# Patient Record
Sex: Female | Born: 1990
Health system: Western US, Academic
[De-identification: ages and names within clinical notes are randomized; demographics above are authoritative.]

---

## 2013-10-02 ENCOUNTER — Ambulatory Visit (INDEPENDENT_AMBULATORY_CARE_PROVIDER_SITE_OTHER): Payer: BC Managed Care – PPO | Admitting: Emergency Medicine

## 2013-10-02 ENCOUNTER — Ambulatory Visit (INDEPENDENT_AMBULATORY_CARE_PROVIDER_SITE_OTHER): Payer: BC Managed Care – PPO

## 2013-10-02 VITALS — BP 116/68 | HR 96 | Temp 98.1°F | Resp 18 | Ht 66.75 in | Wt 129.6 lb

## 2013-10-02 DIAGNOSIS — M549 Dorsalgia, unspecified: Secondary | ICD-10-CM

## 2013-10-02 DIAGNOSIS — S139XXA Sprain of joints and ligaments of unspecified parts of neck, initial encounter: Secondary | ICD-10-CM

## 2013-10-02 DIAGNOSIS — S335XXA Sprain of ligaments of lumbar spine, initial encounter: Secondary | ICD-10-CM

## 2013-10-02 MED ORDER — NAPROXEN SODIUM 550 MG PO TABS: 550.0000 mg | ORAL_TABLET | Freq: Two times a day (BID) | ORAL | Status: AC

## 2013-10-02 MED ORDER — CYCLOBENZAPRINE HCL 10 MG PO TABS: 10.0000 mg | ORAL_TABLET | Freq: Three times a day (TID) | ORAL | Status: AC | PRN
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
## 2015-06-20 IMAGING — CR DG LUMBAR SPINE COMPLETE 4+V
5 series · 5 of 5 positions shown · non-contrast
Comparison: None.

CLINICAL DATA: Low back pain.

EXAM:
LUMBAR SPINE - COMPLETE 4+ VIEW

[AP]
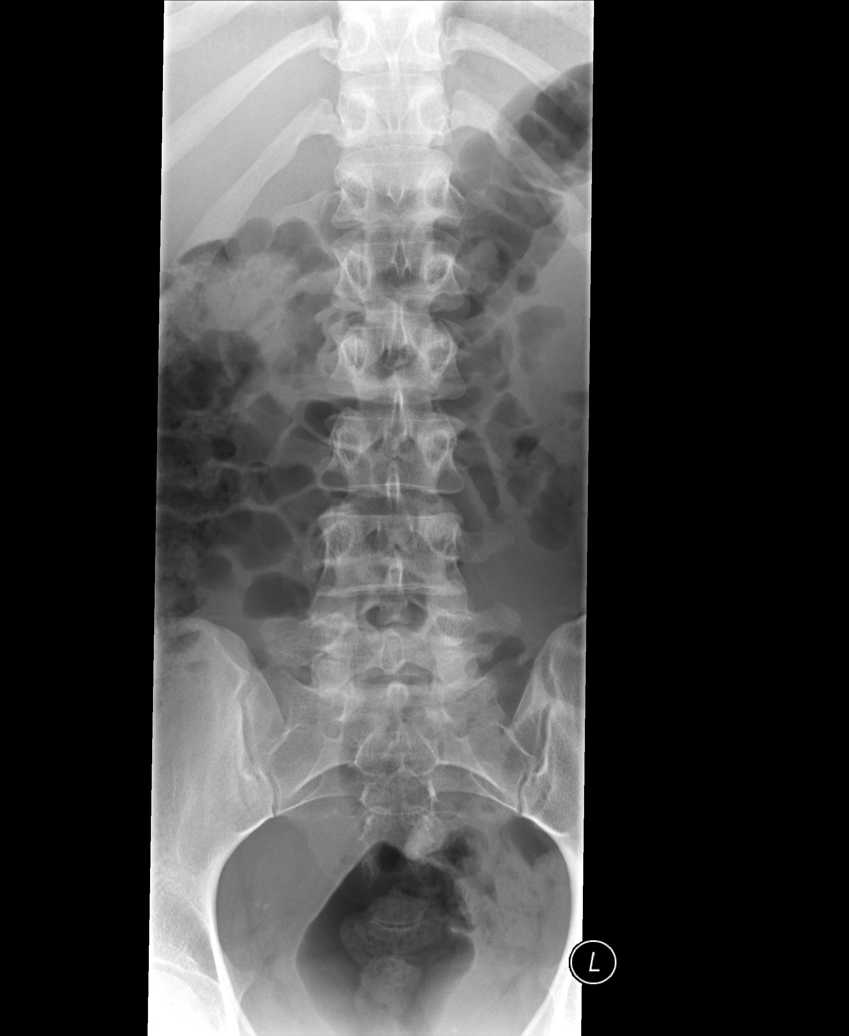

[rpo]
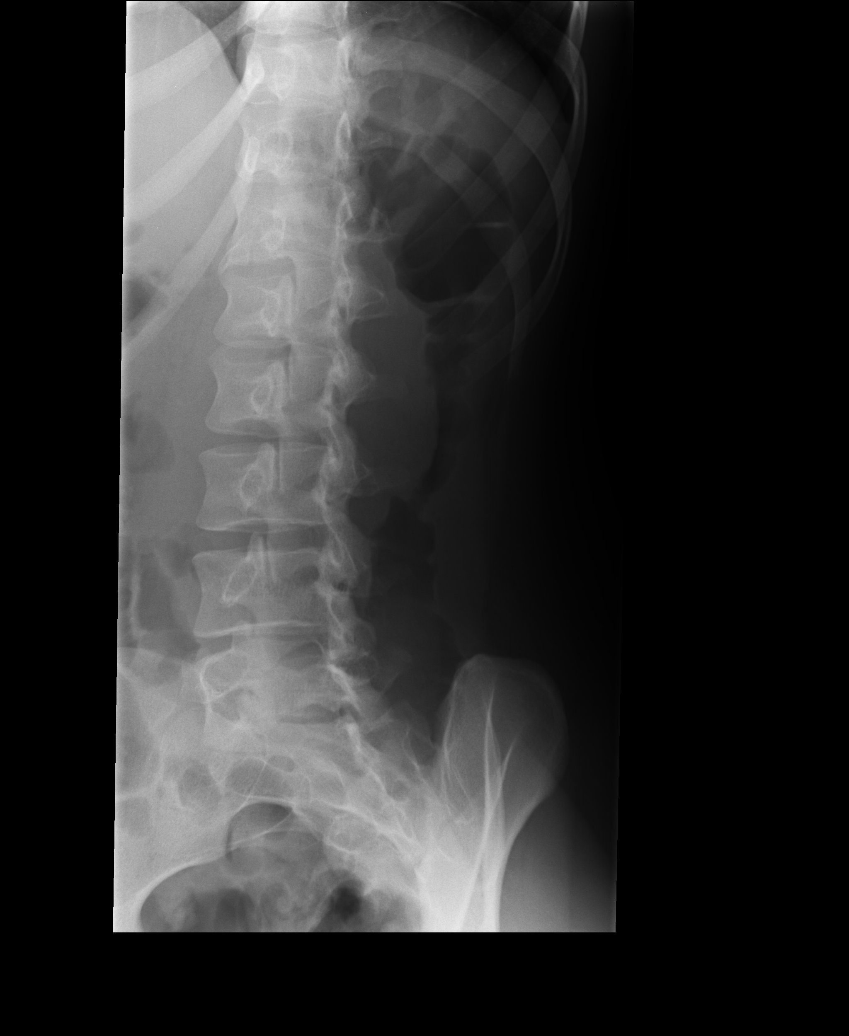

[lpo]
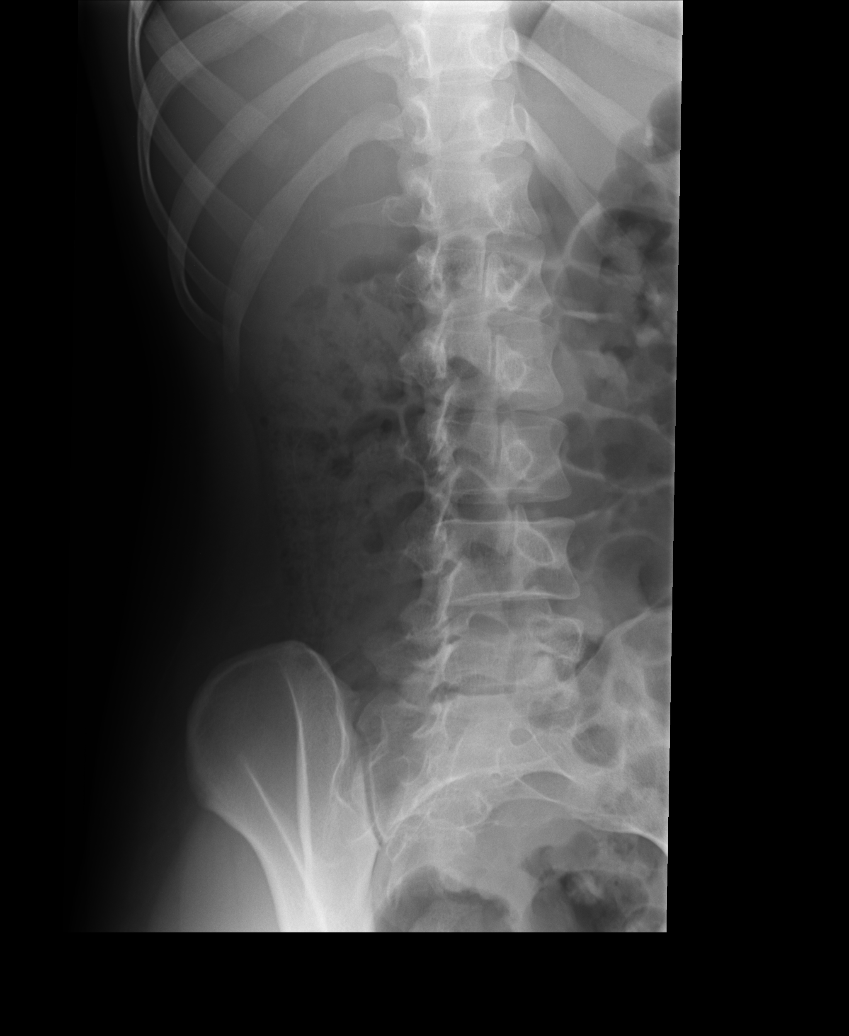

[lateral]
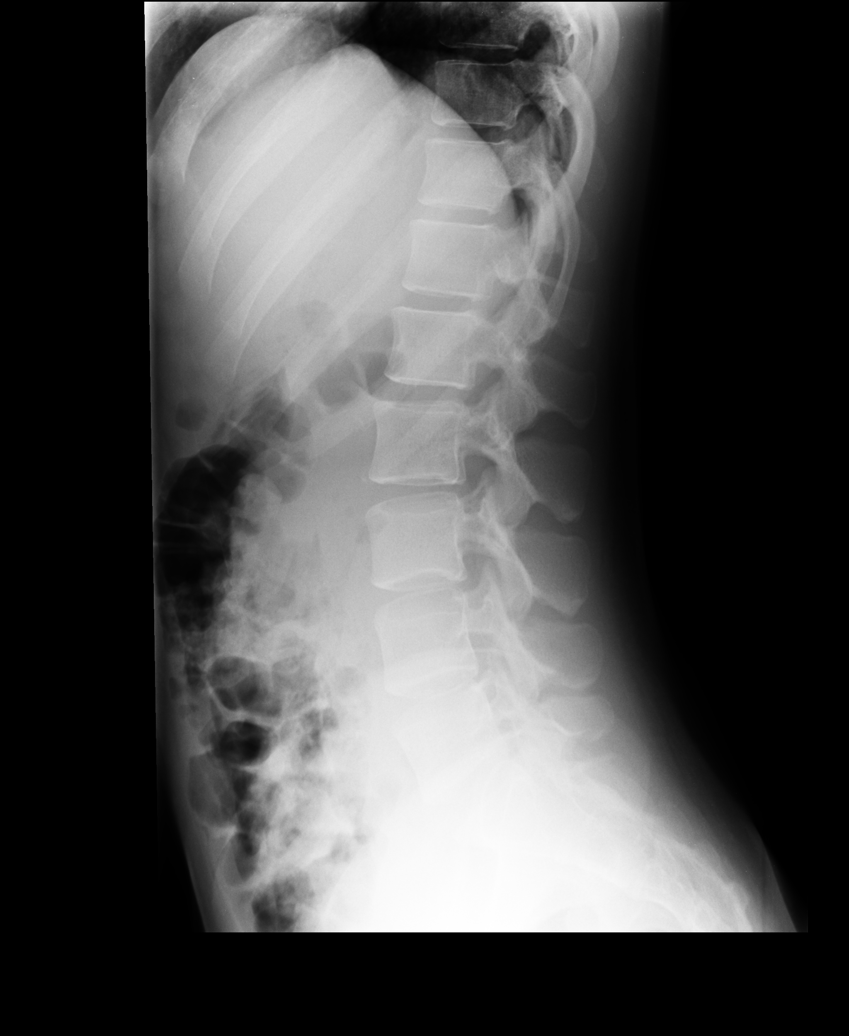

[l5 s1]
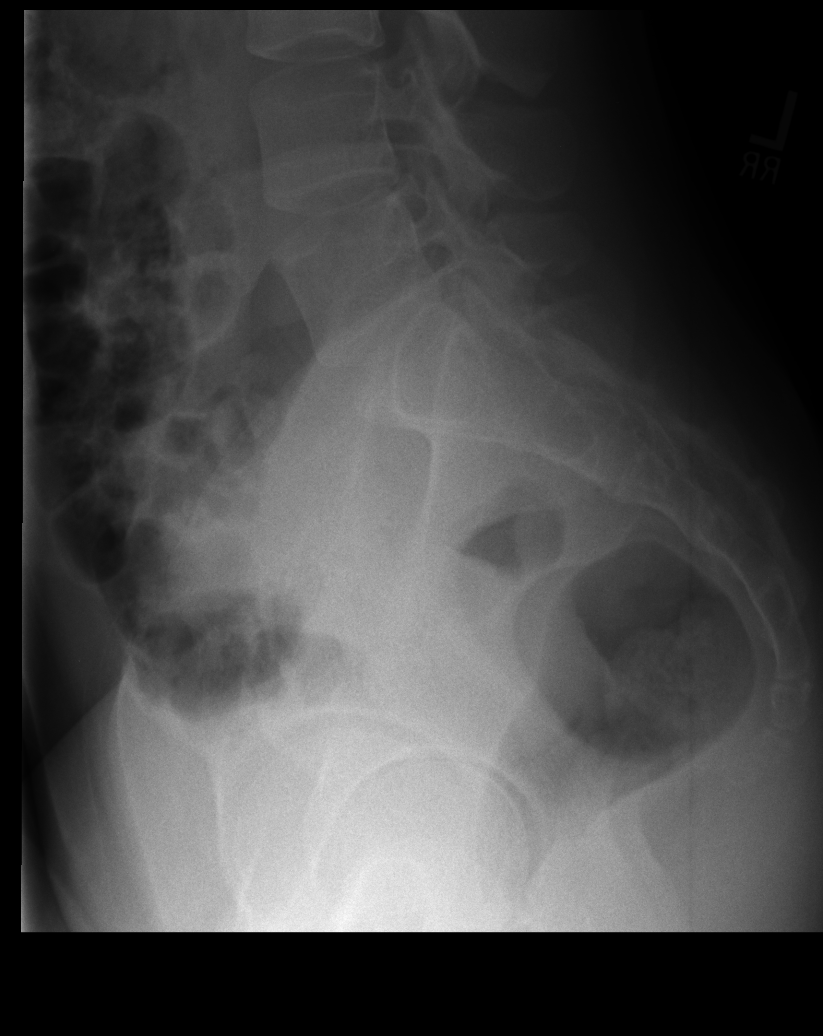

[5 of 5 positions shown; findings below may reference images not displayed]

FINDINGS: There is no evidence of lumbar spine fracture. Alignment is normal.
Intervertebral disc spaces are maintained. No facet arthritis.
IMPRESSION: Normal exam.

## 2018-03-21 ENCOUNTER — Inpatient Hospital Stay
Admit: 2018-03-21 | Discharge: 2018-03-21 | Disposition: A | Discharge status: Home / Self Care | Payer: BLUE CROSS/BLUE SHIELD | Source: Home / Self Care | Attending: Emergency Medical Services

## 2018-03-21 DIAGNOSIS — R8290 Unspecified abnormal findings in urine: Secondary | ICD-10-CM

## 2018-03-21 LAB — Pregnancy Test,Urine: PREGNANCY TEST,URINE: NEGATIVE

## 2018-03-21 LAB — UA,Microscopic: WBCS HPF: 12 {cells}/[HPF] — ABNORMAL HIGH (ref 0–4)

## 2018-03-21 LAB — UA,Dipstick

## 2018-03-21 NOTE — ED Provider Notes
Sabrina Martinez [facility]  Emergency Department Service Report    Triage     Sabrina Martinez, a 27 y.o. female, presents with Urine problem (Malodorous urine few days PTA, no other reported sx; Hx of frequent yeast infection)    Arrived on 03/21/2018 at 12:54 PM   Arrived by Walk-in [[id number]]    ED Triage Vitals [03/21/18 1258]   Temp Temp Source BP Heart Rate Resp SpO2 O2 Device Pain Score Weight   36.7 ???C (98 ???F) Oral 120/80 78 17 97 % None (Room air) Zero 61.2 kg (135 lb)       No Known Allergies     Initial Physician Contact       Comprehensive Exam Initiated  Contact Date: 03/21/18  Contact Time: 1343    History   HPI     Sabrina Martinez is a 27 y.o. female with history of recurrent yeast infection earlier this year, presenting with malodorous urine x few days. Patient noticed a strong odor from her urine a few days ago and has been trying to drink more water. However, even with increasing her water intake, her urine odor did not improve. She endorses mild vaginal discharge; discharge is not clumpy and not odorous Patient states symptoms do not feel similar to her prior urinary tract infection or yeast infections.     Went to urgent care a few weeks ago, normal urinalysis  She tried taloimg    dpes not feel like prior urinary tract infection             Denies dysuria, urinary frequency, vaginal bleeding, fever, nausea, vomiting     Patient is sexually active with 1 partner, just once, last encounter 2-3 weeks ago   Denies history of STI, but was treated for an STI with her prior yeast infections     Past Medical History:   Diagnosis Date   ??? Yeast infection         No past surgical history on file.     Past Family History   family history is not on file.     Past Social History   she has no history on file for tobacco, alcohol, drug, and sexual activity.     Review of Systems    Physical Exam   Physical Exam      Medical Decision Making   Sabrina Martinez is a 27 y.o. female ***        Chart Review Previous medical records requested.  Pertinent items reviewed    ED Course      Laboratory Results     Labs Reviewed   UA,DIPSTICK - Abnormal; Notable for the following components:       Result Value    Ketones Trace (*)     Leukocyte Esterase 1+ (*)     All other components within normal limits   UA,MICROSCOPIC - Abnormal; Notable for the following components:    WBC per uL 61 (*)     WBC per HPF 12 (*)     Bacteria Present (*)     Squamous Epi Cells 75 (*)     All other components within normal limits   PREGNANCY TEST,URINE - Normal   URINALYSIS,ROUTINE    Narrative:     The following orders were created for panel order UA complete (dipstick + microscopy).  Procedure  Abnormality         Status                     ---------                               -----------         ------                     UA,Dipstick[[id number]]                  Abnormal            Final result               UA,Microscopic[[id number]]               Abnormal            Final result                 Please view results for these tests on the individual orders.       Imaging Results     No orders to display       Consults       Time               Service  ***:     {plan; consultations:24168}    Progress Notes / Reassessments          Time               Notes    ***:     ***    ***:  ***    ED Procedure Notes     Any ED procedures performed are documented on separate ED procedure notes.    Clinical Impression   No diagnosis found.    Disposition and Follow-up   Disposition:     No future appointments.    Follow up with:  No follow-up provider specified.    Return precautions are specified on After Visit Summary.    New Prescriptions    No medications on file       Orders Placed This Encounter   ??? UA complete (dipstick + microscopy)   ??? Urine pregnancy (ICON)   ??? UA,Dipstick   ??? UA,Microscopic       Scribe Signature   I, Tresa Res, have acted as a Stage manager for [employer] on behalf of Dr. Marland Kitchen 03/21/2018 at 1:45 PM.    Physician Signature     ***please add [email or web address] and add VL, thanks!

## 2018-03-21 NOTE — ED Notes
Pt VSS. A&Ox4. Breathing even and unlabored, without distress. D/C Instructions given to pt. Verbalized understanding. Discharge instructions handout received by pt. Ambulated with strong steady gait out of ED.

## 2018-03-23 LAB — Bacterial Culture Urine
BACTERIAL CULTURE URINE: 100000 — AB
BACTERIAL CULTURE URINE: 100000 — AB
BACTERIAL CULTURE URINE: 100000 — AB

## 2018-03-26 LAB — Chlamydia trachomatis/Neisseria gonorrhoeae PCR: NEISSERIA GONORRHOEAE PCR: NEGATIVE
